# Patient Record
Sex: Male | Born: 2000 | Race: Black or African American | Hispanic: No | Marital: Single | Smoking: Never smoker
Health system: Southern US, Community
[De-identification: ages and names within clinical notes are randomized; demographics above are authoritative.]

## PROBLEM LIST (undated history)

## (undated) ENCOUNTER — Ambulatory Visit

## (undated) DIAGNOSIS — R109 Unspecified abdominal pain: Secondary | ICD-10-CM

## (undated) DIAGNOSIS — J45909 Unspecified asthma, uncomplicated: Secondary | ICD-10-CM

## (undated) DIAGNOSIS — K921 Melena: Secondary | ICD-10-CM

## (undated) HISTORY — DX: Unspecified abdominal pain: R10.9

## (undated) HISTORY — DX: Melena: K92.1

---
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
## 2021-01-19 IMAGING — DX DG ANKLE COMPLETE 3+V*R*
3 series · 3 of 3 positions shown · non-contrast
Comparison: None.

CLINICAL DATA: Football injury.  Lower leg pain.

EXAM:
RIGHT ANKLE - COMPLETE 3+ VIEW

[ankle ap]
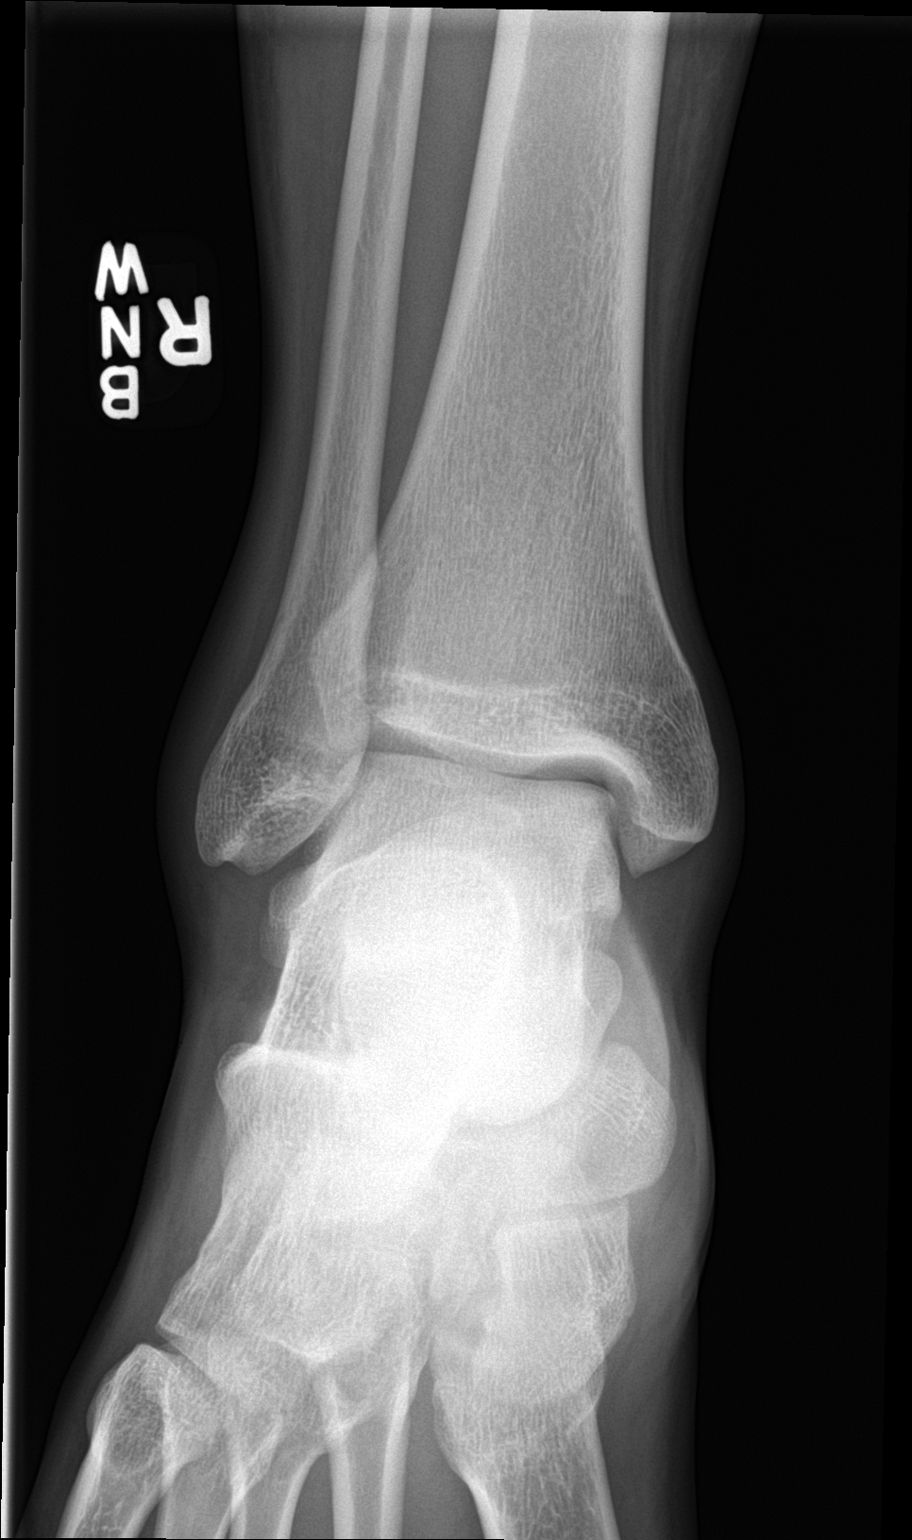

[ankle obl]
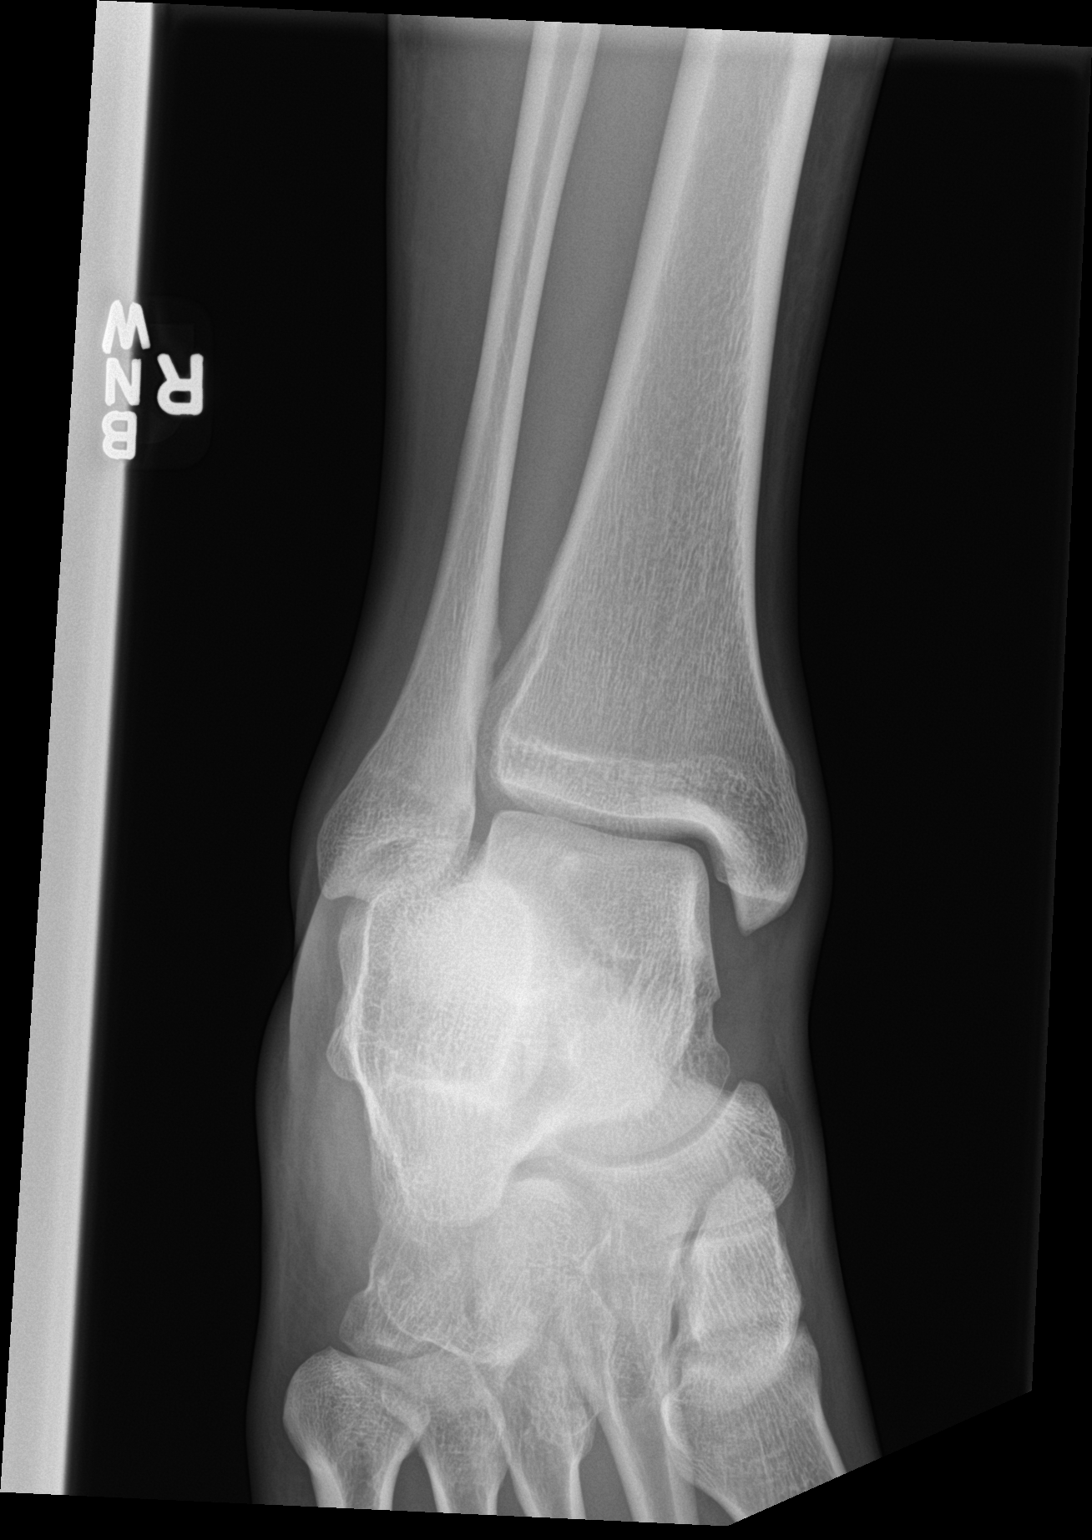

[ankle lat]
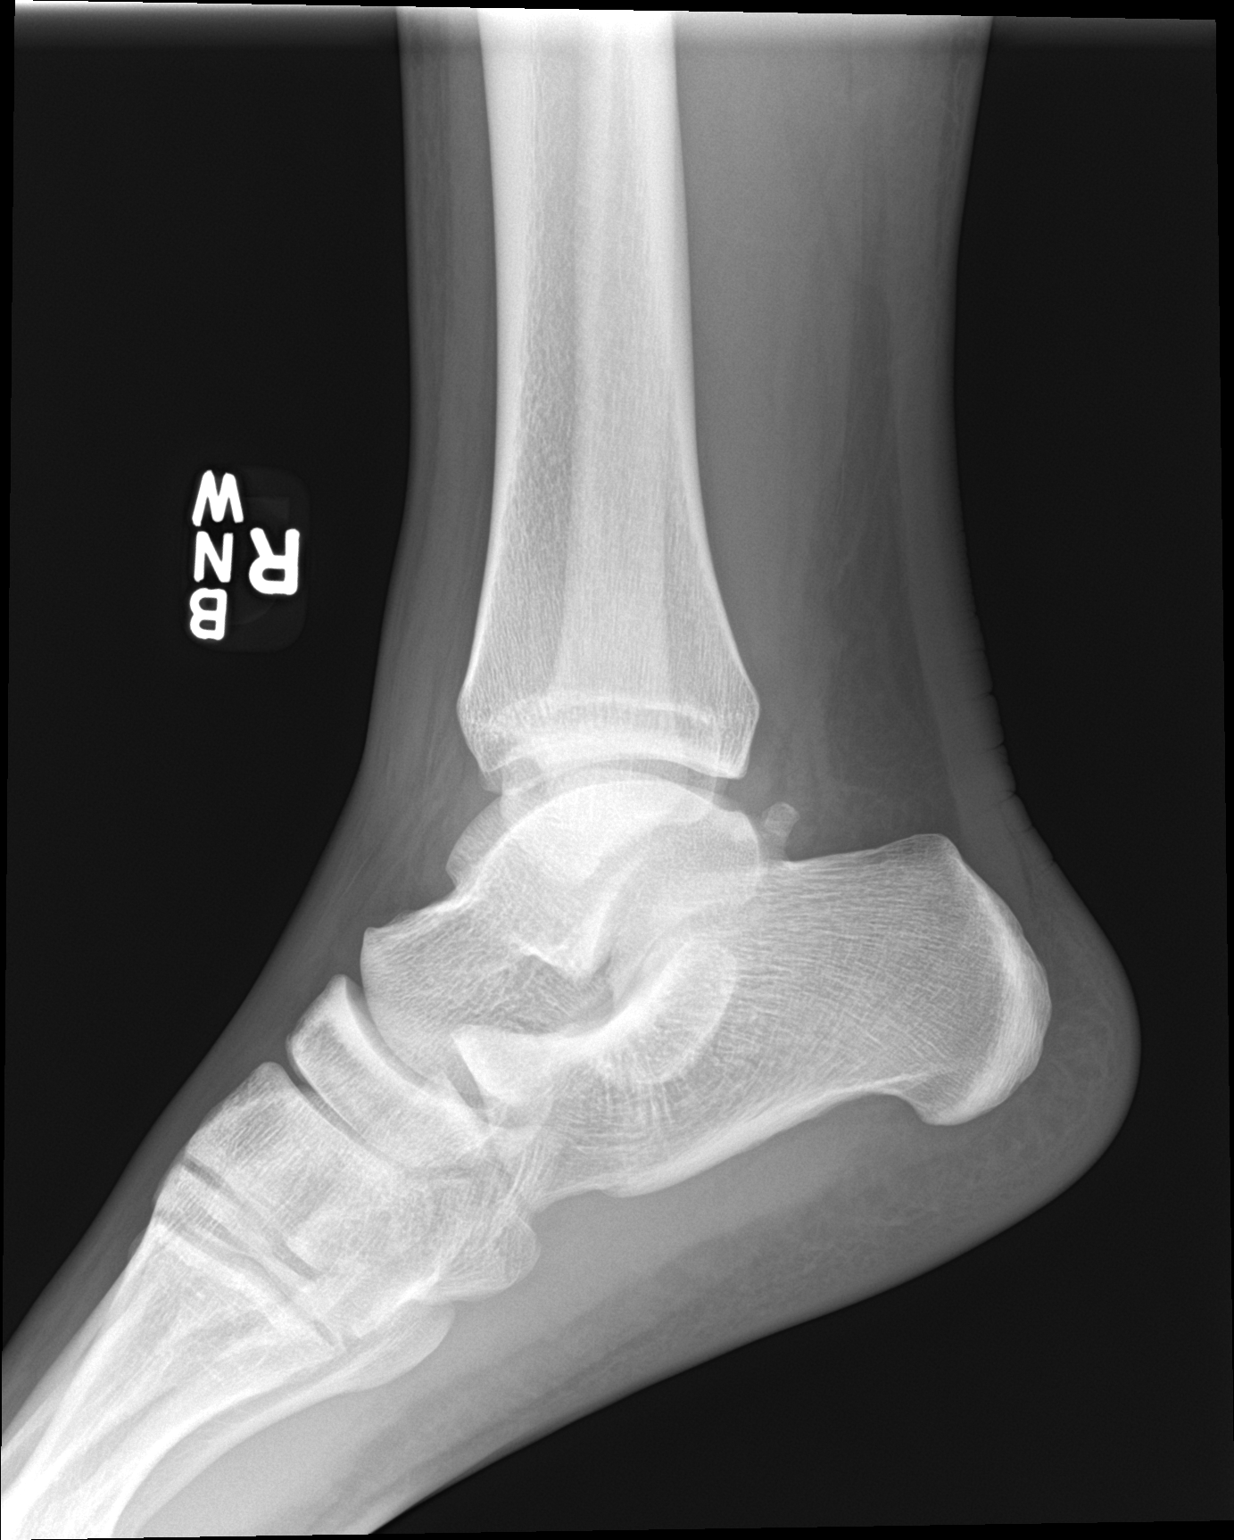

[3 of 3 positions shown; findings below may reference images not displayed]

FINDINGS: There is no evidence of fracture, dislocation, or joint effusion.
There is no evidence of arthropathy or other focal bone abnormality.
Soft tissues are unremarkable.
IMPRESSION: Negative.
